# Patient Record
Sex: Female | Born: 1980 | Hispanic: Yes | Marital: Single
Health system: Southern US, Community
[De-identification: ages and names within clinical notes are randomized; demographics above are authoritative.]

---

## 2005-03-20 ENCOUNTER — Ambulatory Visit: Payer: Self-pay | Admitting: Family Medicine

## 2005-07-31 ENCOUNTER — Inpatient Hospital Stay: Payer: Self-pay

## 2008-07-18 ENCOUNTER — Encounter: Payer: Self-pay | Admitting: Maternal & Fetal Medicine

## 2008-11-07 ENCOUNTER — Inpatient Hospital Stay: Payer: Self-pay

## 2016-08-28 ENCOUNTER — Ambulatory Visit
Admission: RE | Admit: 2016-08-28 | Discharge: 2016-08-28 | Disposition: A | Discharge status: Home / Self Care | Payer: Self-pay | Source: Ambulatory Visit | Attending: Oncology | Admitting: Oncology

## 2016-08-28 ENCOUNTER — Ambulatory Visit: Payer: Self-pay | Attending: Oncology

## 2016-08-28 VITALS — BP 129/82 | HR 76 | Temp 99.0°F | Ht 62.21 in | Wt 154.1 lb

## 2016-08-28 DIAGNOSIS — N63 Unspecified lump in unspecified breast: Secondary | ICD-10-CM

## 2016-08-28 NOTE — Progress Notes (Signed)
Subjective:     Patient ID: Meghan RankinsSobeida Jimenez Gutierrez, female   DOB: 06-Jun-1981, 35 y.o.   MRN: [id number]  HPI   Review of Systems     Objective:   Physical Exam  Pulmonary/Chest: Right breast exhibits no inverted nipple, no mass, no nipple discharge, no skin change and no tenderness. Left breast exhibits no inverted nipple, no mass, no nipple discharge, no skin change and no tenderness. Breasts are symmetrical.    Fibroglandular tissue bilateral upper outer quadrants       Assessment:     35 year old hispanic patient, fluent in [place], [facility] that patient has noticed for 1 month.  Her [facility]   Patient screened, and meets [facility] eligibility.  Patient does not have insurance, Medicare or Medicaid.  Handout given on Affordable Care Act.  Instructed patient on breast self-exam using teach back method.  CBE reveals fibroglandular tissue bilateral upper outer quadrant.      Plan:     Sent bilateral diagnostic mammogram, and ultrasound.

## 2016-09-04 NOTE — Progress Notes (Signed)
Patient had Birads 1 results from diagnostic mammogram, and ultrasound.  She is to return for annual mammograms at age 35.  Copy to [facility].

## 2019-01-02 ENCOUNTER — Other Ambulatory Visit: Payer: Self-pay

## 2019-01-02 ENCOUNTER — Emergency Department
Admission: EM | Admit: 2019-01-02 | Discharge: 2019-01-02 | Disposition: A | Discharge status: Home / Self Care | Payer: No Typology Code available for payment source | Attending: Emergency Medicine | Admitting: Emergency Medicine

## 2019-01-02 ENCOUNTER — Encounter: Payer: Self-pay | Admitting: Emergency Medicine

## 2019-01-02 ENCOUNTER — Emergency Department: Payer: No Typology Code available for payment source

## 2019-01-02 DIAGNOSIS — S161XXA Strain of muscle, fascia and tendon at neck level, initial encounter: Secondary | ICD-10-CM | POA: Diagnosis not present

## 2019-01-02 DIAGNOSIS — S20211A Contusion of right front wall of thorax, initial encounter: Secondary | ICD-10-CM | POA: Diagnosis not present

## 2019-01-02 DIAGNOSIS — Y999 Unspecified external cause status: Secondary | ICD-10-CM | POA: Diagnosis not present

## 2019-01-02 DIAGNOSIS — Y9389 Activity, other specified: Secondary | ICD-10-CM | POA: Insufficient documentation

## 2019-01-02 DIAGNOSIS — S199XXA Unspecified injury of neck, initial encounter: Secondary | ICD-10-CM | POA: Diagnosis present

## 2019-01-02 DIAGNOSIS — Y92410 Unspecified street and highway as the place of occurrence of the external cause: Secondary | ICD-10-CM | POA: Diagnosis not present

## 2019-01-02 MED ORDER — MELOXICAM 15 MG PO TABS: 15.0000 mg | ORAL_TABLET | Freq: Every day | ORAL | 2 refills | Status: AC

## 2019-01-02 MED ORDER — BACLOFEN 10 MG PO TABS: 10.0000 mg | ORAL_TABLET | Freq: Three times a day (TID) | ORAL | 1 refills | Status: AC
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
## 2019-01-02 NOTE — Discharge Instructions (Addendum)
Follow-up with your regular doctor as needed.  Follow-up with Dr. Martha Clan for any orthopedic problems and if you feel you need physical therapy.  Take medications as prescribed.  Apply ice to all areas that hurt.  Return emergency department worsening.
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
